# Patient Record
Sex: Male | Born: 1990 | Race: White | Hispanic: No | Marital: Single | Smoking: Never smoker
Health system: Southern US, Community
[De-identification: ages and names within clinical notes are randomized; demographics above are authoritative.]

## PROBLEM LIST (undated history)

## (undated) HISTORY — PX: NO PAST SURGERIES: SHX2092

---

## 2019-01-27 ENCOUNTER — Other Ambulatory Visit: Payer: Self-pay | Admitting: *Deleted

## 2019-01-27 DIAGNOSIS — Z20822 Contact with and (suspected) exposure to covid-19: Secondary | ICD-10-CM

## 2019-01-28 ENCOUNTER — Telehealth: Payer: Self-pay

## 2019-01-28 LAB — NOVEL CORONAVIRUS, NAA: SARS-CoV-2, NAA: NOT DETECTED

## 2019-01-28 NOTE — Telephone Encounter (Signed)
Patient called in requesting [facility] lab results and MyChart setup assistance - DOB/Address verified - Negative results given. Assisted with MyChart setup, no further questions.

## 2019-02-24 ENCOUNTER — Other Ambulatory Visit: Payer: Self-pay

## 2019-02-24 DIAGNOSIS — Z20822 Contact with and (suspected) exposure to covid-19: Secondary | ICD-10-CM

## 2019-02-27 LAB — NOVEL CORONAVIRUS, NAA: SARS-CoV-2, NAA: NOT DETECTED

## 2019-09-21 ENCOUNTER — Ambulatory Visit (INDEPENDENT_AMBULATORY_CARE_PROVIDER_SITE_OTHER): Payer: Managed Care, Other (non HMO) | Admitting: Adult Health

## 2019-09-21 ENCOUNTER — Encounter: Payer: Self-pay | Admitting: Adult Health

## 2019-09-21 ENCOUNTER — Ambulatory Visit
Admission: RE | Admit: 2019-09-21 | Discharge: 2019-09-21 | Disposition: A | Discharge status: Home / Self Care | Payer: Managed Care, Other (non HMO) | Source: Ambulatory Visit | Attending: Adult Health | Admitting: Adult Health

## 2019-09-21 ENCOUNTER — Ambulatory Visit
Admission: RE | Admit: 2019-09-21 | Discharge: 2019-09-21 | Disposition: A | Discharge status: Home / Self Care | Payer: Managed Care, Other (non HMO) | Attending: Adult Health | Admitting: Adult Health

## 2019-09-21 ENCOUNTER — Other Ambulatory Visit: Payer: Self-pay

## 2019-09-21 VITALS — BP 130/80 | HR 73 | Temp 96.8°F | Resp 15 | Ht 71.5 in | Wt 191.2 lb

## 2019-09-21 DIAGNOSIS — R0683 Snoring: Secondary | ICD-10-CM | POA: Diagnosis not present

## 2019-09-21 DIAGNOSIS — Z1389 Encounter for screening for other disorder: Secondary | ICD-10-CM

## 2019-09-21 DIAGNOSIS — Z0000 Encounter for general adult medical examination without abnormal findings: Secondary | ICD-10-CM | POA: Diagnosis not present

## 2019-09-21 DIAGNOSIS — R062 Wheezing: Secondary | ICD-10-CM

## 2019-09-21 DIAGNOSIS — G4730 Sleep apnea, unspecified: Secondary | ICD-10-CM | POA: Insufficient documentation

## 2019-09-21 DIAGNOSIS — G4700 Insomnia, unspecified: Secondary | ICD-10-CM

## 2019-09-21 DIAGNOSIS — Q825 Congenital non-neoplastic nevus: Secondary | ICD-10-CM

## 2019-09-21 DIAGNOSIS — Z8709 Personal history of other diseases of the respiratory system: Secondary | ICD-10-CM

## 2019-09-21 DIAGNOSIS — Z13220 Encounter for screening for lipoid disorders: Secondary | ICD-10-CM

## 2019-09-21 DIAGNOSIS — Z6826 Body mass index (BMI) 26.0-26.9, adult: Secondary | ICD-10-CM | POA: Insufficient documentation

## 2019-09-21 DIAGNOSIS — R5383 Other fatigue: Secondary | ICD-10-CM | POA: Insufficient documentation

## 2019-09-21 LAB — POCT URINALYSIS DIPSTICK
Blood, UA: NEGATIVE
Glucose, UA: NEGATIVE
Ketones, UA: NEGATIVE
Leukocytes, UA: NEGATIVE
Nitrite, UA: NEGATIVE
Protein, UA: NEGATIVE
Spec Grav, UA: 1.02 (ref 1.010–1.025)
Urobilinogen, UA: 0.2 E.U./dL
pH, UA: 6.5 (ref 5.0–8.0)

## 2019-09-21 MED ORDER — ALBUTEROL SULFATE HFA 108 (90 BASE) MCG/ACT IN AERS: 1.0000 | INHALATION_SPRAY | Freq: Four times a day (QID) | RESPIRATORY_TRACT | 0 refills | Status: AC | PRN

## 2019-09-21 NOTE — Progress Notes (Signed)
Chest x ray within normal limits. Keep plan as discussed in office. Follow up if any symptoms persist/ change or worsen at anytime.

## 2019-09-21 NOTE — Progress Notes (Signed)
New patient visit   Patient: Gary Patton   DOB: May 11, 1990   28 y.o. Male  MRN: [id number] Visit Date: 09/21/2019  Today's healthcare provider: Jairo Ben, FNP   Chief Complaint  Patient presents with   New Patient (Initial Visit)   Subjective    Gary Patton is a 29 y.o. male who presents today as a new patient to establish care.  HPI  Patient reports that he feels fairly well today but would like to address concerns of sleep apnea. Patient reports that he lives with his girlfriend and she has noticed for the past year and half that patient stops breathing at night when asleep.   Patient denies difficulty falling asleep or snoring, he states that he does wake up occasionally but averages 6 hrs a night. Patient reports that he does not follow a well balanced diet but is working on improving, he is not actively exercising at this time.    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  Patient does report that he does well going up stairs, denies any shortness of breath or distress.  Denies any asthma allergy symptoms.  Patient did have a respiratory infection with a cough 2 weeks ago, feeling well, but still fatigued.  Patient  denies any fever, body aches,chills, rash, chest pain,nausea, vomiting, or diarrhea.  Denies dizziness, lightheadedness, pre syncopal or syncopal episodes.   History reviewed. No pertinent past medical history. Past Surgical History:  Procedure Laterality Date   NO PAST SURGERIES     Family Status  Relation Name Status   Father  (Not Specified)   MGF  (Not Specified)   Family History  Problem Relation Age of Onset   Hypertension Father    Alzheimer's disease Maternal Grandfather    Dementia Maternal Grandfather    Social History   Socioeconomic History   Marital status: Single    Spouse name: Not on file   Number of children: Not on file   Years of education: Not on file   Highest  education level: Not on file  Occupational History   Not on file  Tobacco Use   Smoking status: Never Smoker   Smokeless tobacco: Never Used  Substance and Sexual Activity   Alcohol use: Yes    Alcohol/week: 4.0 standard drinks    Types: 3 Cans of beer, 1 Shots of liquor per week   Drug use: Never   Sexual activity: Not on file  Other Topics Concern   Not on file  Social History Narrative   Not on file   Social Determinants of Health   Financial Resource Strain:    Difficulty of Paying Living Expenses:   Food Insecurity:    Worried About Programme researcher, broadcasting/film/video in the Last Year:    Barista in the Last Year:   Transportation Needs:    Freight forwarder (Medical):    Lack of Transportation (Non-Medical):   Physical Activity:    Days of Exercise per Week:    Minutes of Exercise per Session:   Stress:    Feeling of Stress :   Social Connections:    Frequency of Communication with Friends and Family:    Frequency of Social Gatherings with Friends and Family:    Attends Religious Services:    Active Member of Clubs or Organizations:    Attends Banker Meetings:    Marital Status:    No  outpatient medications prior to visit.   No facility-administered medications prior to visit.   No Known Allergies   There is no immunization history on file for this patient.  Health Maintenance  Topic Date Due   Hepatitis C Screening  Never done   COVID-19 Vaccine (1) Never done   HIV Screening  Never done   TETANUS/TDAP  Never done   INFLUENZA VACCINE  11/07/2019    Patient Care Team: Berniece Pap, FNP as PCP - General (Family Medicine)  Review of Systems  Constitutional: Positive for fatigue. Negative for activity change, appetite change, chills, diaphoresis, fever and unexpected weight change.  HENT:       Ear fullness and trouble hearing at times.  Respiratory: Positive for apnea (Noticed a girlfriend at night when  sleeping.) and shortness of breath (Only at night upon wakening.). Negative for cough, choking, chest tightness, wheezing and stridor.        Respiratory infection over 2 weeks ago coughing was not seen in any clinic.  Cardiovascular: Negative for chest pain, palpitations and leg swelling.  Gastrointestinal: Negative.   Genitourinary: Negative.   Musculoskeletal: Negative.   Skin: Positive for color change (Port wine stain left dorsal hand.  Has not since birth.).  Neurological: Negative.   Hematological: Negative.   Psychiatric/Behavioral: Positive for decreased concentration and sleep disturbance. Negative for agitation, behavioral problems, confusion, dysphoric mood, hallucinations, self-injury and suicidal ideas. The patient is not nervous/anxious and is not hyperactive.   All other systems reviewed and are negative.     Objective    BP 130/80    Pulse 73    Temp (!) 96.8 F (36 C) (Oral)    Resp 15    Ht 5' 11.5" (1.816 m)    Wt 191 lb 3.2 oz (86.7 kg)    SpO2 99%    BMI 26.30 kg/m  Physical Exam Vitals reviewed.  Constitutional:      General: He is not in acute distress.    Appearance: Normal appearance. He is not ill-appearing, toxic-appearing or diaphoretic.     Comments: Patient is alert and oriented and responsive to questions Engages in eye contact with provider. Speaks in full sentences without any pauses without any shortness of breath or distress.  Skin is pale.  HENT:     Head: Normocephalic and atraumatic.     Right Ear: Tympanic membrane, ear canal and external ear normal. There is impacted cerumen.     Left Ear: Tympanic membrane, ear canal and external ear normal. There is impacted cerumen (partial).     Ears:     Comments: Right ear hard cerumen     Nose: Nose normal. No congestion or rhinorrhea.     Mouth/Throat:     Mouth: Mucous membranes are moist.     Pharynx: Oropharynx is clear. No oropharyngeal exudate or posterior oropharyngeal erythema.  Eyes:      General: No scleral icterus.    Extraocular Movements: Extraocular movements intact.     Conjunctiva/sclera: Conjunctivae normal.     Pupils: Pupils are equal, round, and reactive to light.  Neck:     Vascular: No carotid bruit.  Cardiovascular:     Rate and Rhythm: Normal rate and regular rhythm.     Pulses: Normal pulses.     Heart sounds: Normal heart sounds. No murmur heard.  No friction rub. No gallop.   Pulmonary:     Effort: Pulmonary effort is normal. No respiratory distress.     Breath  sounds: No stridor. Examination of the right-lower field reveals wheezing. Examination of the left-lower field reveals wheezing. Wheezing (lower lobes ( bilateral) mild expiratory) present. No rhonchi or rales.  Chest:     Chest wall: No tenderness.  Abdominal:     General: Bowel sounds are normal. There is no distension.     Palpations: Abdomen is soft. There is no mass.     Tenderness: There is no abdominal tenderness. There is no right CVA tenderness, left CVA tenderness, guarding or rebound.     Hernia: No hernia is present.  Genitourinary:    Epididymis:     Right: No tenderness.     Left: No mass or tenderness.     Tanner stage (genital): 5.     Comments: Declined does self testicular exams at home. Musculoskeletal:        General: No tenderness or deformity. Normal range of motion.     Cervical back: Normal range of motion and neck supple. No rigidity or tenderness.  Lymphadenopathy:     Cervical: No cervical adenopathy.  Skin:    General: Skin is warm and dry.     Capillary Refill: Capillary refill takes less than 2 seconds.     Comments: Port wine stain left hand  Neurological:     General: No focal deficit present.     Mental Status: He is alert and oriented to person, place, and time. Mental status is at baseline.     Cranial Nerves: No cranial nerve deficit.     Sensory: No sensory deficit.     Motor: No weakness.     Coordination: Coordination normal.     Gait: Gait  normal.     Deep Tendon Reflexes: Reflexes normal.  Psychiatric:        Mood and Affect: Mood normal.        Behavior: Behavior normal.        Thought Content: Thought content normal.        Judgment: Judgment normal.      Depression Screen PHQ 2/9 Scores 09/21/2019  PHQ - 2 Score 2  PHQ- 9 Score 5   No results found for any visits on 09/21/19.  Assessment & Plan       1. Routine adult health maintenance The patient is advised to begin progressive daily aerobic exercise program, follow a low fat, low cholesterol diet, attempt to lose weight, reduce exposure to stress, improve dietary compliance, continue current healthy lifestyle patterns and return for routine annual checkups. Dental biannually and vision exams recommend yearly.   2. Screening for blood or protein in urine  - POCT urinalysis dipstick  3. Loud snoring Sp[lit night sleep study  - Ambulatory referral to Sleep Studies  4. Wheezing  - DG Chest 2 View; Future - albuterol (VENTOLIN HFA) 108 (90 Base) MCG/ACT inhaler; Inhale 1-2 puffs into the lungs every 6 (six) hours as needed for wheezing or shortness of breath.  Dispense: 18 g; Refill: 0  5. Fatigue, unspecified type  - CBC with Differential/Platelet - Comprehensive metabolic panel - TSH - VITAMIN D 25 Hydroxy (Vit-D Deficiency, Fractures) - Ambulatory referral to Sleep Studies  6. Screening for lipid disorders - Lipid panel  7. Insomnia, unspecified type - Ambulatory referral to Sleep Studies  8. History of URI (upper respiratory infection)- 2 weeks ago  CXR to rule out any post pneumonia. CXR today.   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    Return in  about 1 month (around 10/21/2019).      Advised patient call the office or your primary care doctor for an appointment if no improvement within 72 hours or if any symptoms change or worsen at any time  Advised ER or urgent Care if after hours or on weekend. Call 911 for  emergency symptoms at any time.Patinet verbalized understanding of all instructions given/reviewed and treatment plan and has no further questions or concerns at this time.      IBeverely Pace Quinten [place], FNP, have reviewed all documentation for this visit. The documentation on 09/21/19 for the exam, diagnosis, procedures, and orders are all accurate and complete.    Jairo Ben, FNP  [facility] [phone] (phone) [phone] (fax)  [facility]

## 2019-09-21 NOTE — Patient Instructions (Signed)
Health Maintenance, Male Adopting a healthy lifestyle and getting preventive care are important in promoting health and wellness. Ask your health care provider about:  The right schedule for you to have regular tests and exams.  Things you can do on your own to prevent diseases and keep yourself healthy. What should I know about diet, weight, and exercise? Eat a healthy diet   Eat a diet that includes plenty of vegetables, fruits, low-fat dairy products, and lean protein.  Do not eat a lot of foods that are high in solid fats, added sugars, or sodium. Maintain a healthy weight Body mass index (BMI) is a measurement that can be used to identify possible weight problems. It estimates body fat based on height and weight. Your health care provider can help determine your BMI and help you achieve or maintain a healthy weight. Get regular exercise Get regular exercise. This is one of the most important things you can do for your health. Most adults should:  Exercise for at least 150 minutes each week. The exercise should increase your heart rate and make you sweat (moderate-intensity exercise).  Do strengthening exercises at least twice a week. This is in addition to the moderate-intensity exercise.  Spend less time sitting. Even light physical activity can be beneficial. Watch cholesterol and blood lipids Have your blood tested for lipids and cholesterol at 27 years of age, then have this test every 5 years. You may need to have your cholesterol levels checked more often if:  Your lipid or cholesterol levels are high.  You are older than 27 years of age.  You are at high risk for heart disease. What should I know about cancer screening? Many types of cancers can be detected early and may often be prevented. Depending on your health history and family history, you may need to have cancer screening at various ages. This may include screening for:  Colorectal cancer.  Prostate  cancer.  Skin cancer.  Lung cancer. What should I know about heart disease, diabetes, and high blood pressure? Blood pressure and heart disease  High blood pressure causes heart disease and increases the risk of stroke. This is more likely to develop in people who have high blood pressure readings, are of African descent, or are overweight.  Talk with your health care provider about your target blood pressure readings.  Have your blood pressure checked: ? Every 3-5 years if you are 18-39 years of age. ? Every year if you are 40 years old or older.  If you are between the ages of 65 and 75 and are a current or former smoker, ask your health care provider if you should have a one-time screening for abdominal aortic aneurysm (AAA). Diabetes Have regular diabetes screenings. This checks your fasting blood sugar level. Have the screening done:  Once every three years after age 45 if you are at a normal weight and have a low risk for diabetes.  More often and at a younger age if you are overweight or have a high risk for diabetes. What should I know about preventing infection? Hepatitis B If you have a higher risk for hepatitis B, you should be screened for this virus. Talk with your health care provider to find out if you are at risk for hepatitis B infection. Hepatitis C Blood testing is recommended for:  Everyone born from 1945 through 1965.  Anyone with known risk factors for hepatitis C. Sexually transmitted infections (STIs)  You should be screened each year   for STIs, including gonorrhea and chlamydia, if: ? You are sexually active and are younger than 27 years of age. ? You are older than 27 years of age and your health care provider tells you that you are at risk for this type of infection. ? Your sexual activity has changed since you were last screened, and you are at increased risk for chlamydia or gonorrhea. Ask your health care provider if you are at risk.  Ask your  health care provider about whether you are at high risk for HIV. Your health care provider may recommend a prescription medicine to help prevent HIV infection. If you choose to take medicine to prevent HIV, you should first get tested for HIV. You should then be tested every 3 months for as long as you are taking the medicine. Follow these instructions at home: Lifestyle  Do not use any products that contain nicotine or tobacco, such as cigarettes, e-cigarettes, and chewing tobacco. If you need help quitting, ask your health care provider.  Do not use street drugs.  Do not share needles.  Ask your health care provider for help if you need support or information about quitting drugs. Alcohol use  Do not drink alcohol if your health care provider tells you not to drink.  If you drink alcohol: ? Limit how much you have to 0-2 drinks a day. ? Be aware of how much alcohol is in your drink. In the [place] one drink equals one 12 oz bottle of beer (355 mL), one 5 oz glass of wine (148 mL), or one 1 oz glass of hard liquor (44 mL). General instructions  Schedule regular health, dental, and eye exams.  Stay current with your vaccines.  Tell your health care provider if: ? You often feel depressed. ? You have ever been abused or do not feel safe at home. Summary  Adopting a healthy lifestyle and getting preventive care are important in promoting health and wellness.  Follow your health care provider's instructions about healthy diet, exercising, and getting tested or screened for diseases.  Follow your health care provider's instructions on monitoring your cholesterol and blood pressure. This information is not intended to replace advice given to you by your health care provider. Make sure you discuss any questions you have with your health care provider. Document Revised: 03/18/2018 Document Reviewed: 03/18/2018 Elsevier Patient Education  [place].   Albuterol inhalation  aerosol What is this medicine? ALBUTEROL (al [place]) is a bronchodilator. It helps open up the airways in your lungs to make it easier to breathe. This medicine is used to treat and to prevent bronchospasm. This medicine may be used for other purposes; ask your health care provider or pharmacist if you have questions. COMMON BRAND NAME(S): Proair HFA, Proventil, Proventil HFA, Respirol, Ventolin, Ventolin HFA What should I tell my health care provider before I take this medicine? They need to know if you have any of the following conditions:  diabetes  heart disease or irregular heartbeat  high blood pressure  pheochromocytoma  seizures  thyroid disease  an unusual or allergic reaction to albuterol, levalbuterol, other medicines, foods, dyes, or preservatives  pregnant or trying to get pregnant  breast-feeding How should I use this medicine? This medicine is for inhalation through the mouth. Follow the directions on your prescription label. Take your medicine at regular intervals. Do not use more often than directed. Make sure that you are using your inhaler correctly. Ask your doctor or health  care provider if you have any questions. Talk to your pediatrician regarding the use of this medicine in children. While this drug may be prescribed for children as young as 4 years for selected conditions, precautions do apply. Overdosage: If you think you have taken too much of this medicine contact a poison control center or emergency room at once. NOTE: This medicine is only for you. Do not share this medicine with others. What if I miss a dose? If you miss a dose, use it as soon as you can. If it is almost time for your next dose, use only that dose. Do not use double or extra doses. What may interact with this medicine?  anti-infectives like chloroquine and pentamidine  caffeine  cisapride  diuretics  medicines for colds  medicines for depression or for emotional or  psychotic conditions  medicines for weight loss including some herbal products  methadone  some antibiotics like clarithromycin, erythromycin, levofloxacin, and linezolid  some heart medicines  steroid hormones like dexamethasone, cortisone, hydrocortisone  theophylline  thyroid hormones This list may not describe all possible interactions. Give your health care provider a list of all the medicines, herbs, non-prescription drugs, or dietary supplements you use. Also tell them if you smoke, drink alcohol, or use illegal drugs. Some items may interact with your medicine. What should I watch for while using this medicine? Tell your doctor or health care professional if your symptoms do not improve. Do not use extra albuterol. If your asthma or bronchitis gets worse while you are using this medicine, call your doctor right away. If your mouth gets dry try chewing sugarless gum or sucking hard candy. Drink water as directed. What side effects may I notice from receiving this medicine? Side effects that you should report to your doctor or health care professional as soon as possible:  allergic reactions like skin rash, itching or hives, swelling of the face, lips, or tongue  breathing problems  chest pain  feeling faint or lightheaded, falls  high blood pressure  irregular heartbeat  fever  muscle cramps or weakness  pain, tingling, numbness in the hands or feet  vomiting Side effects that usually do not require medical attention (report to your doctor or health care professional if they continue or are bothersome):  changes in taste  cough  dry mouth  headache  nervousness or trembling  stomach upset  stuffy or runny nose  throat irritation  trouble sleeping This list may not describe all possible side effects. Call your doctor for medical advice about side effects. You may report side effects to FDA at 1-800-FDA-1088. Where should I keep my medicine? Keep out  of the reach of children. Store Proventil HFA and ProAir HFA at room temperature between 15 and 25 degrees C (59 and 77 degrees F). Store Ventolin HFA at room temperature between 20 and 25 degrees C (68 and 77 degrees F); it may be stored between 15 and 30 degrees C (59 and 86 degrees F) on occasion. The contents are under pressure and may burst when exposed to heat or flame. Do not freeze. This medicine does not work as well if it is too cold. Throw away the inhaler when the dose counter displays "0" or after the expiration date on the package, whichever comes first. Ventolin HFA should be thrown away 12 months after removing it from the foil pouch. NOTE: This sheet is a summary. It may not cover all possible information. If you have questions about this  medicine, talk to your doctor, pharmacist, or health care provider.  2020 Elsevier/Gold Standard (2018-07-09 12:46:54)  Testicular Self-Exam A self-exam of your testicles (testicular self-exam) is looking at and feeling your testicles for unusual lumps or swelling. Swelling, lumps, or pain can be caused by:  Injuries.  Puffiness, redness, and soreness (inflammation).  Infection.  Extra fluids around your testicle (hydrocele).  Twisted testicles (testicular torsion).  Cancer of the testicle (testicular cancer). Why is it important to do a self-exam of testicles? You may need to do self-exams if you are at risk for cancer of the testicles. You may be at risk if you have:  A testicle that has not descended (cryptorchidism).  A history of cancer of the testicle.  A family history of cancer of the testicle. How to do a self-exam of testicles It is easiest to do a self-exam after a warm bath or shower. Testicles are harder to examine when you are cold. A normal testicle is egg-shaped and feels firm. It is smooth, and it is not tender. At the back of your testicles, there is a firm cord that feels like spaghetti (spermatic cord). Look and  feel for changes  Stand and hold your penis away from your body.  Look at each testicle to check for lumps or swelling.  Roll each testicle between your thumb and finger. Feel the whole testicle. Feel for: ? Lumps. ? Swelling. ? Discomfort.  Check for swelling or tender bumps in the groin area. Your groin is where your lower belly (abdomen) meets your upper thighs. Contact a health care provider if:  You find a bump or lump. This may be like a small, hard bump that is the size of a pea.  You find swelling.  You find pain.  You find soreness.  You see or feel any other changes. Summary  A self-exam of your testicles is looking at and feeling your testicles for lumps or swelling.  You may need to do self-exams if you are at risk for cancer of the testicle.  You should check each of your testicles for lumps, swelling, or discomfort.  You should check for swelling or tender bumps in the groin area. Your groin is where your lower belly (abdomen) meets your upper thighs. This information is not intended to replace advice given to you by your health care provider. Make sure you discuss any questions you have with your health care provider. Document Revised: 07/16/2018 Document Reviewed: 02/19/2016 Elsevier Patient Education  2020 [employer].

## 2019-09-22 ENCOUNTER — Other Ambulatory Visit: Payer: Self-pay | Admitting: Adult Health

## 2019-09-22 DIAGNOSIS — E559 Vitamin D deficiency, unspecified: Secondary | ICD-10-CM | POA: Insufficient documentation

## 2019-09-22 LAB — CBC WITH DIFFERENTIAL/PLATELET
Basophils Absolute: 0 10*3/uL (ref 0.0–0.2)
Basos: 1 %
EOS (ABSOLUTE): 0.2 10*3/uL (ref 0.0–0.4)
Eos: 3 %
Hematocrit: 40.4 % (ref 37.5–51.0)
Hemoglobin: 13.2 g/dL (ref 13.0–17.7)
Immature Grans (Abs): 0 10*3/uL (ref 0.0–0.1)
Immature Granulocytes: 1 %
Lymphocytes Absolute: 1.8 10*3/uL (ref 0.7–3.1)
Lymphs: 27 %
MCH: 28.9 pg (ref 26.6–33.0)
MCHC: 32.7 g/dL (ref 31.5–35.7)
MCV: 89 fL (ref 79–97)
Monocytes Absolute: 0.6 10*3/uL (ref 0.1–0.9)
Monocytes: 9 %
Neutrophils Absolute: 4 10*3/uL (ref 1.4–7.0)
Neutrophils: 59 %
Platelets: 300 10*3/uL (ref 150–450)
RBC: 4.56 x10E6/uL (ref 4.14–5.80)
RDW: 12.9 % (ref 11.6–15.4)
WBC: 6.7 10*3/uL (ref 3.4–10.8)

## 2019-09-22 LAB — VITAMIN D 25 HYDROXY (VIT D DEFICIENCY, FRACTURES): Vit D, 25-Hydroxy: 16.7 ng/mL — ABNORMAL LOW (ref 30.0–100.0)

## 2019-09-22 LAB — COMPREHENSIVE METABOLIC PANEL
ALT: 31 IU/L (ref 0–44)
AST: 24 IU/L (ref 0–40)
Albumin/Globulin Ratio: 2 (ref 1.2–2.2)
Albumin: 4.7 g/dL (ref 4.1–5.2)
Alkaline Phosphatase: 65 IU/L (ref 48–121)
BUN/Creatinine Ratio: 12 (ref 9–20)
BUN: 10 mg/dL (ref 6–20)
Bilirubin Total: 0.3 mg/dL (ref 0.0–1.2)
CO2: 22 mmol/L (ref 20–29)
Calcium: 9.4 mg/dL (ref 8.7–10.2)
Chloride: 104 mmol/L (ref 96–106)
Creatinine, Ser: 0.83 mg/dL (ref 0.76–1.27)
GFR calc Af Amer: 140 mL/min/{1.73_m2} (ref >59)
GFR calc non Af Amer: 121 mL/min/{1.73_m2} (ref >59)
Globulin, Total: 2.3 g/dL (ref 1.5–4.5)
Glucose: 90 mg/dL (ref 65–99)
Potassium: 4 mmol/L (ref 3.5–5.2)
Sodium: 140 mmol/L (ref 134–144)
Total Protein: 7 g/dL (ref 6.0–8.5)

## 2019-09-22 LAB — LIPID PANEL
Chol/HDL Ratio: 3.4 ratio (ref 0.0–5.0)
Cholesterol, Total: 185 mg/dL (ref 100–199)
HDL: 55 mg/dL (ref >39)
LDL Chol Calc (NIH): 117 mg/dL — ABNORMAL HIGH (ref 0–99)
Triglycerides: 70 mg/dL (ref 0–149)
VLDL Cholesterol Cal: 13 mg/dL (ref 5–40)

## 2019-09-22 LAB — TSH: TSH: 1.86 u[IU]/mL (ref 0.450–4.500)

## 2019-09-22 MED ORDER — VITAMIN D (ERGOCALCIFEROL) 1.25 MG (50000 UNIT) PO CAPS: 50000.0000 [IU] | ORAL_CAPSULE | ORAL | 0 refills | Status: DC

## 2019-09-22 NOTE — Progress Notes (Signed)
CBC within normal limits no anemia or signs of infection. CMP within normal limits glucose, kidney and liver function within normal limits.  LDL elevated.  Discuss lifestyle modification with patient e.g. increase exercise, fiber, fruits, vegetables, lean meat, and omega 3/fish intake and decrease saturated fat.   TSH for thyroid within normal limits.  Vitamin D is low, can be contributing to his fatigue, will send in Vitamin D at 50,000 units by mouth once every seven days ( taking once weekly) for 12 weeks. Then will need a recheck Vitamin D level in 3 months at lab. Please add as future order.

## 2019-09-22 NOTE — Progress Notes (Signed)
Meds ordered this encounter  Medications  . Vitamin D, Ergocalciferol, (DRISDOL) 1.25 MG (50000 UNIT) CAPS capsule    Sig: Take 1 capsule (50,000 Units total) by mouth every 7 (seven) days. (once weekly)    Dispense:  12 capsule    Refill:  0   Orders Placed This Encounter  Procedures  . VITAMIN D 25 Hydroxy (Vit-D Deficiency, Fractures)    Standing Status:   Future    Standing Expiration Date:   01/22/2020

## 2019-10-22 ENCOUNTER — Telehealth: Payer: Self-pay

## 2019-10-22 NOTE — Telephone Encounter (Signed)
Left message for patient to call back okay for [facility] triage to advise patient of message below. KW

## 2019-10-22 NOTE — Telephone Encounter (Signed)
Pt. returned call.  Advised of result note per Marvell Fuller re: negative sleep study, and to schedule office visit for any persistent concerns or symptoms.  Pt. Verb. Understanding.

## 2019-10-22 NOTE — Telephone Encounter (Signed)
-----   Message from Berniece Pap, FNP sent at 10/21/2019 10:09 AM EDT ----- Nicholos Johns,  Please let patient know that his sleep study shows he does not have sleep apnea at this time. Advise to follow up if any persistent concerns or symptoms with office visit if needed.   Thank you,  Marvell Fuller MSN, AGNP-C, FNP-C  Family Nurse Practitioner  Adult Geriatric Nurse Practitioner

## 2019-11-05 ENCOUNTER — Ambulatory Visit: Payer: Self-pay | Admitting: Adult Health

## 2019-11-10 ENCOUNTER — Ambulatory Visit (INDEPENDENT_AMBULATORY_CARE_PROVIDER_SITE_OTHER): Payer: Managed Care, Other (non HMO) | Admitting: Adult Health

## 2019-11-10 ENCOUNTER — Other Ambulatory Visit: Payer: Self-pay

## 2019-11-10 ENCOUNTER — Encounter: Payer: Self-pay | Admitting: Adult Health

## 2019-11-10 VITALS — BP 122/84 | HR 85 | Temp 98.4°F | Wt 192.0 lb

## 2019-11-10 DIAGNOSIS — Z8709 Personal history of other diseases of the respiratory system: Secondary | ICD-10-CM

## 2019-11-10 DIAGNOSIS — R062 Wheezing: Secondary | ICD-10-CM | POA: Diagnosis not present

## 2019-11-10 DIAGNOSIS — E559 Vitamin D deficiency, unspecified: Secondary | ICD-10-CM | POA: Diagnosis not present

## 2019-11-10 NOTE — Progress Notes (Signed)
Established patient visit   Patient: Gary Patton   DOB: [birth year]   29 y.o. Male  MRN: [id number] Visit Date: 11/10/2019  Today's healthcare provider: Jairo Ben, FNP   Chief Complaint  Patient presents with  . Wheezing  . Follow-up   Subjective    HPI    Follow up for Wheezing  The patient was last seen for this 1 months ago. Changes made at last visit include adding albuterol inhaler as needed.  He was recovering from a mild upper respiratory infection from 2 weeks prior to our last new patient appointment and incidently wheezing was heard on exam. He is here today for follow up. He was given albuterol and has only had to use twice and his symptoms all resolved he reports.  He did not feel bad at time of exam.   He reports excellent compliance with treatment. He feels that condition is resolved . He is not having side effects.   Continuing to take Vitamin D supplement will return for lab recheck on week 13 when treatment is complete.  No history of asthma.   Patient  denies any fever, body aches,chills, rash, chest pain, shortness of breath, nausea, vomiting, or diarrhea.  Denies dizziness, lightheadedness, pre syncopal or syncopal episodes.  Denies any other concerns.    -----------------------------------------------------------------------------------------    Patient Active Problem List   Diagnosis Date Noted  . Vitamin D insufficiency 09/22/2019  . Insomnia 09/21/2019  . Routine adult health maintenance 09/21/2019  . Fatigue 09/21/2019  . Wheezing 09/21/2019  . Sleep apnea 09/21/2019  . History of URI (upper respiratory infection)- 2 weeks ago  09/21/2019  . Port-wine stain of skin- left hand  09/21/2019  . Body mass index 26.0-26.9, adult 09/21/2019   Social History   Tobacco Use  . Smoking status: Never Smoker  . Smokeless tobacco: Never Used  Substance Use Topics  . Alcohol use: Yes    Alcohol/week: 4.0 standard drinks     Types: 3 Cans of beer, 1 Shots of liquor per week  . Drug use: Never   No Known Allergies   Medications: Outpatient Medications Prior to Visit  Medication Sig  . albuterol (VENTOLIN HFA) 108 (90 Base) MCG/ACT inhaler Inhale 1-2 puffs into the lungs every 6 (six) hours as needed for wheezing or shortness of breath.  . Vitamin D, Ergocalciferol, (DRISDOL) 1.25 MG (50000 UNIT) CAPS capsule Take 1 capsule (50,000 Units total) by mouth every 7 (seven) days. (once weekly)   No facility-administered medications prior to visit.    Review of Systems  Constitutional: Negative.   HENT: Negative.   Respiratory: Negative for apnea, cough, choking, chest tightness, shortness of breath, wheezing and stridor.   Cardiovascular: Negative.   Gastrointestinal: Negative.   Genitourinary: Negative.   Musculoskeletal: Negative.   Skin: Negative.   Neurological: Negative.   Hematological: Negative.   Psychiatric/Behavioral: Negative.       Objective    BP 122/84 (BP Location: Left Arm, Patient Position: Sitting, Cuff Size: Large)   Pulse 85   Temp 98.4 F (36.9 C) (Oral)   Wt 192 lb (87.1 kg)   SpO2 98%   BMI 26.41 kg/m    Physical Exam Constitutional:      General: He is not in acute distress.    Appearance: Normal appearance. He is well-developed. He is not ill-appearing, toxic-appearing or diaphoretic.  HENT:     Head: Normocephalic and atraumatic.     Right Ear: Hearing, tympanic membrane,  ear canal and external ear normal.     Left Ear: Hearing, tympanic membrane, ear canal and external ear normal.     Nose: Nose normal.     Mouth/Throat:     Mouth: Mucous membranes are moist.     Pharynx: Uvula midline. No oropharyngeal exudate.  Eyes:     General: Lids are normal. No scleral icterus.       Right eye: No discharge.        Left eye: No discharge.     Conjunctiva/sclera: Conjunctivae normal.     Pupils: Pupils are equal, round, and reactive to light.  Neck:     Thyroid: No  thyromegaly.     Vascular: Normal carotid pulses. No hepatojugular reflux or JVD.     Trachea: Trachea and phonation normal. No tracheal tenderness or tracheal deviation.     Meningeal: Brudzinski's sign absent.  Cardiovascular:     Rate and Rhythm: Normal rate and regular rhythm.     Pulses: Normal pulses.     Heart sounds: Normal heart sounds, S1 normal and S2 normal. Heart sounds not distant. No murmur heard.  No friction rub. No gallop.   Pulmonary:     Effort: Pulmonary effort is normal. No accessory muscle usage or respiratory distress.     Breath sounds: Normal breath sounds. No stridor. No wheezing, rhonchi or rales.  Chest:     Chest wall: No tenderness.  Abdominal:     Palpations: Abdomen is soft.  Musculoskeletal:        General: No tenderness or deformity. Normal range of motion.     Cervical back: Full passive range of motion without pain, normal range of motion and neck supple. No rigidity or tenderness.     Right lower leg: No edema.     Left lower leg: No edema.  Lymphadenopathy:     Head:     Right side of head: No submental, submandibular, tonsillar, preauricular, posterior auricular or occipital adenopathy.     Left side of head: No submental, submandibular, tonsillar, preauricular, posterior auricular or occipital adenopathy.     Cervical: No cervical adenopathy.  Skin:    General: Skin is warm and dry.     Capillary Refill: Capillary refill takes less than 2 seconds.     Coloration: Skin is not pale.     Findings: No erythema or rash.     Nails: There is no clubbing.  Neurological:     Mental Status: He is alert and oriented to person, place, and time.     GCS: GCS eye subscore is 4. GCS verbal subscore is 5. GCS motor subscore is 6.     Cranial Nerves: No cranial nerve deficit.     Sensory: No sensory deficit.     Motor: No weakness or abnormal muscle tone.     Coordination: Coordination normal.     Gait: Gait normal.     Deep Tendon Reflexes: Reflexes  are normal and symmetric. Reflexes normal.  Psychiatric:        Mood and Affect: Mood normal.        Speech: Speech normal.        Behavior: Behavior normal.        Thought Content: Thought content normal.        Judgment: Judgment normal.       No results found for any visits on 11/10/19.  Assessment & Plan     Vitamin D insufficiency - Plan: VITAMIN D 25 Hydroxy (  Vit-D Deficiency, Fractures)  Wheezing  History of URI (upper respiratory infection)- 2 weeks ago   Wheezing has resolved.declined need for refill of Albuterol.   Recheck vitamin D lab as advised 3 months from last.   Orders Placed This Encounter  Procedures  . VITAMIN D 25 Hydroxy (Vit-D Deficiency, Fractures)    Standing Status:   Future    Standing Expiration Date:   05/13/2020    Red Flags discussed. The patient was given clear instructions to go to ER or return to medical center if any red flags develop, symptoms do not improve, worsen or new problems develop. They verbalized understanding.   Return in about 1 week (around 11/17/2019), or if symptoms worsen or fail to improve, for at any time for any worsening symptoms, Go to Emergency room/ urgent care if worse.         Jairo Ben, FNP  [facility] [phone] (phone) [phone] (fax)  [facility]

## 2019-11-10 NOTE — Patient Instructions (Signed)

## 2019-11-11 ENCOUNTER — Encounter: Payer: Self-pay | Admitting: Adult Health

## 2019-12-23 ENCOUNTER — Other Ambulatory Visit: Payer: Self-pay

## 2019-12-23 DIAGNOSIS — E559 Vitamin D deficiency, unspecified: Secondary | ICD-10-CM

## 2019-12-24 LAB — VITAMIN D 25 HYDROXY (VIT D DEFICIENCY, FRACTURES): Vit D, 25-Hydroxy: 33.3 ng/mL (ref 30.0–100.0)

## 2019-12-27 ENCOUNTER — Other Ambulatory Visit: Payer: Self-pay | Admitting: Adult Health

## 2019-12-27 NOTE — Progress Notes (Signed)
Medications Discontinued During This Encounter  Medication Reason  . Vitamin D, Ergocalciferol, (DRISDOL) 1.25 MG (50000 UNIT) CAPS capsule Completed Course

## 2019-12-27 NOTE — Progress Notes (Signed)
Discontinue prescription vitamin D  now that levels are within range and advise over the counter vitamin D3 at 4,000 international units once daily by mouth. Please add recheck lab in 6 months vitamin D.

## 2022-03-05 IMAGING — CR DG CHEST 2V
1 series · 2 of 2 positions shown · non-contrast
Comparison: None.

CLINICAL DATA: Cough and congestion for 2 weeks

EXAM:
CHEST - 2 VIEW

[Series 1: dg chest 2 view · 0.14mm/px · 2 of 2 slices shown]
[im 1/2]
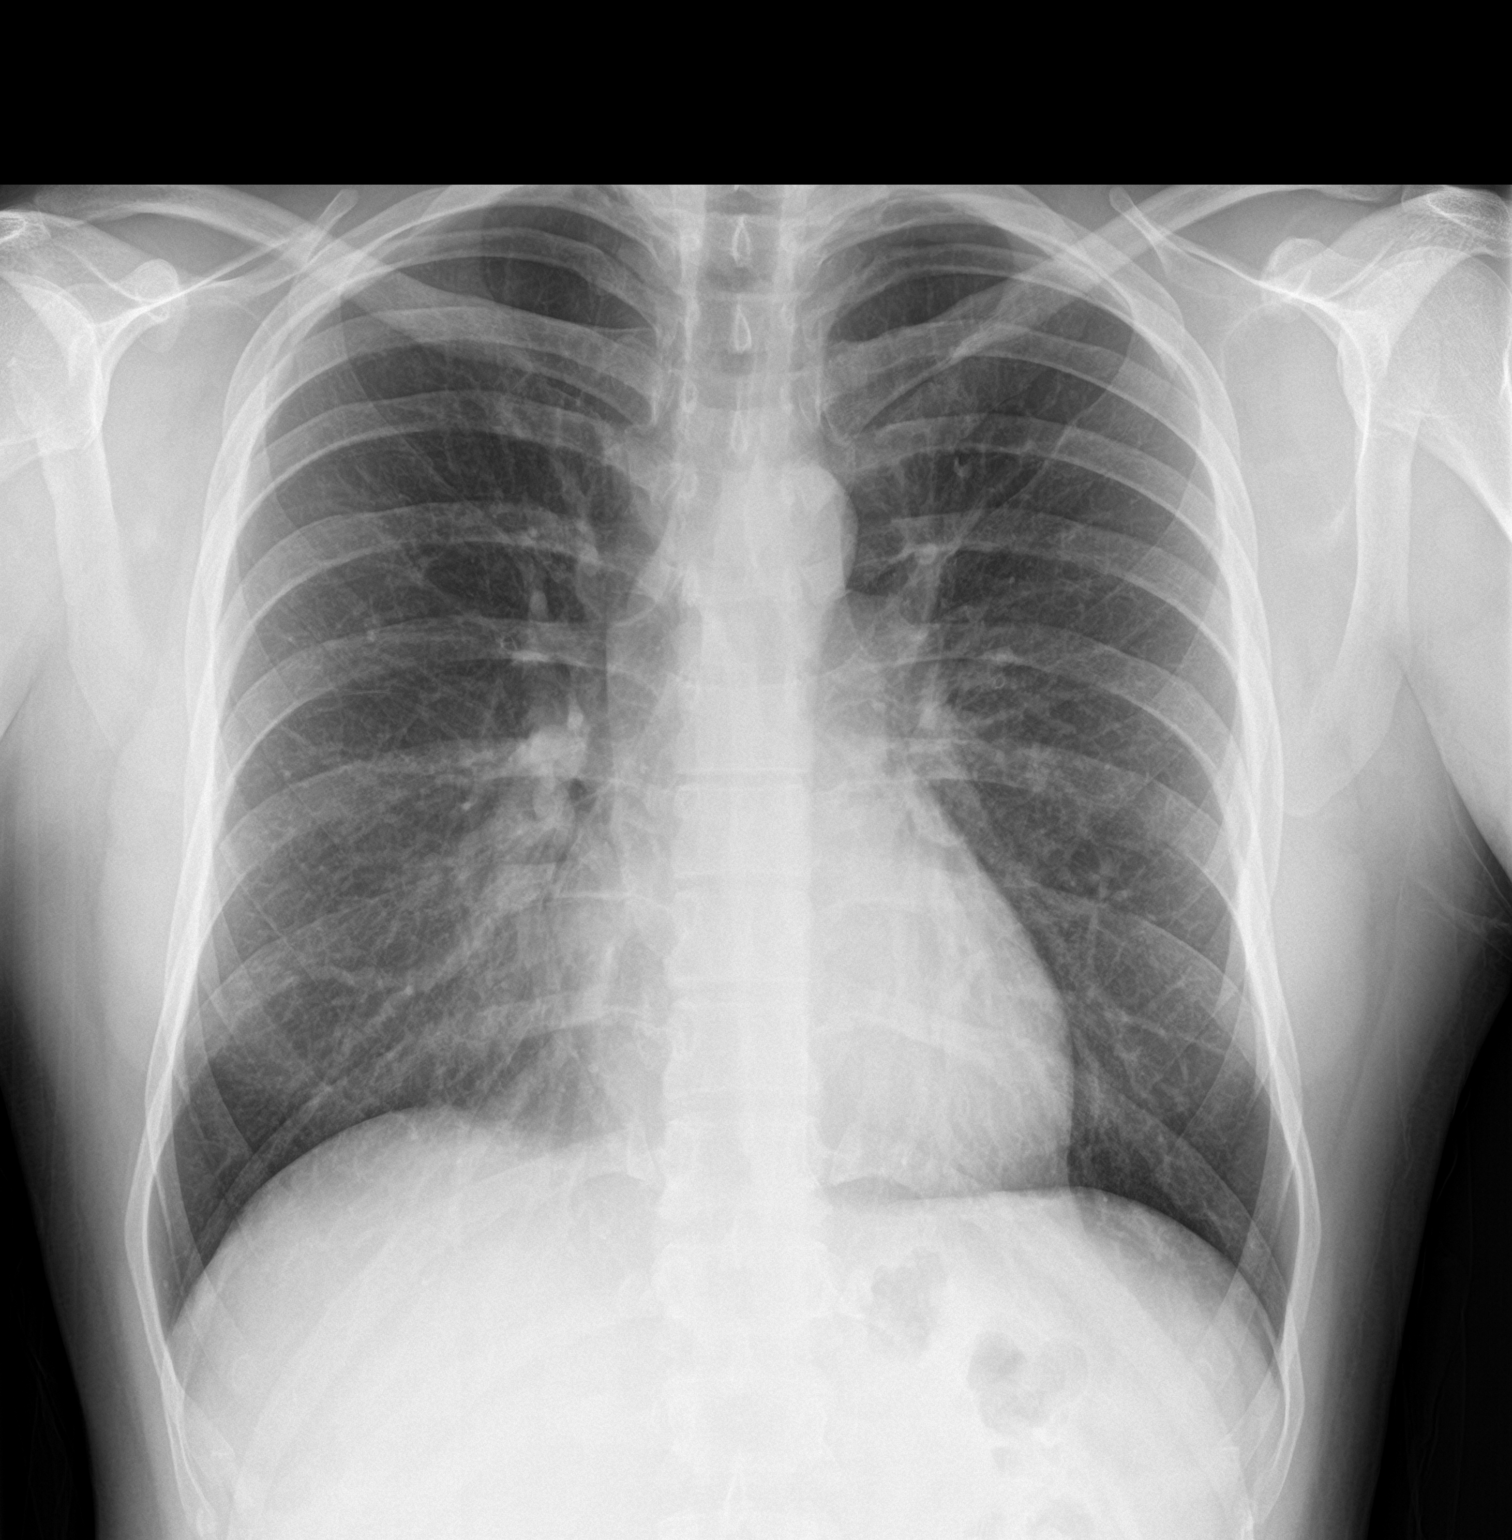
[im 2/2]
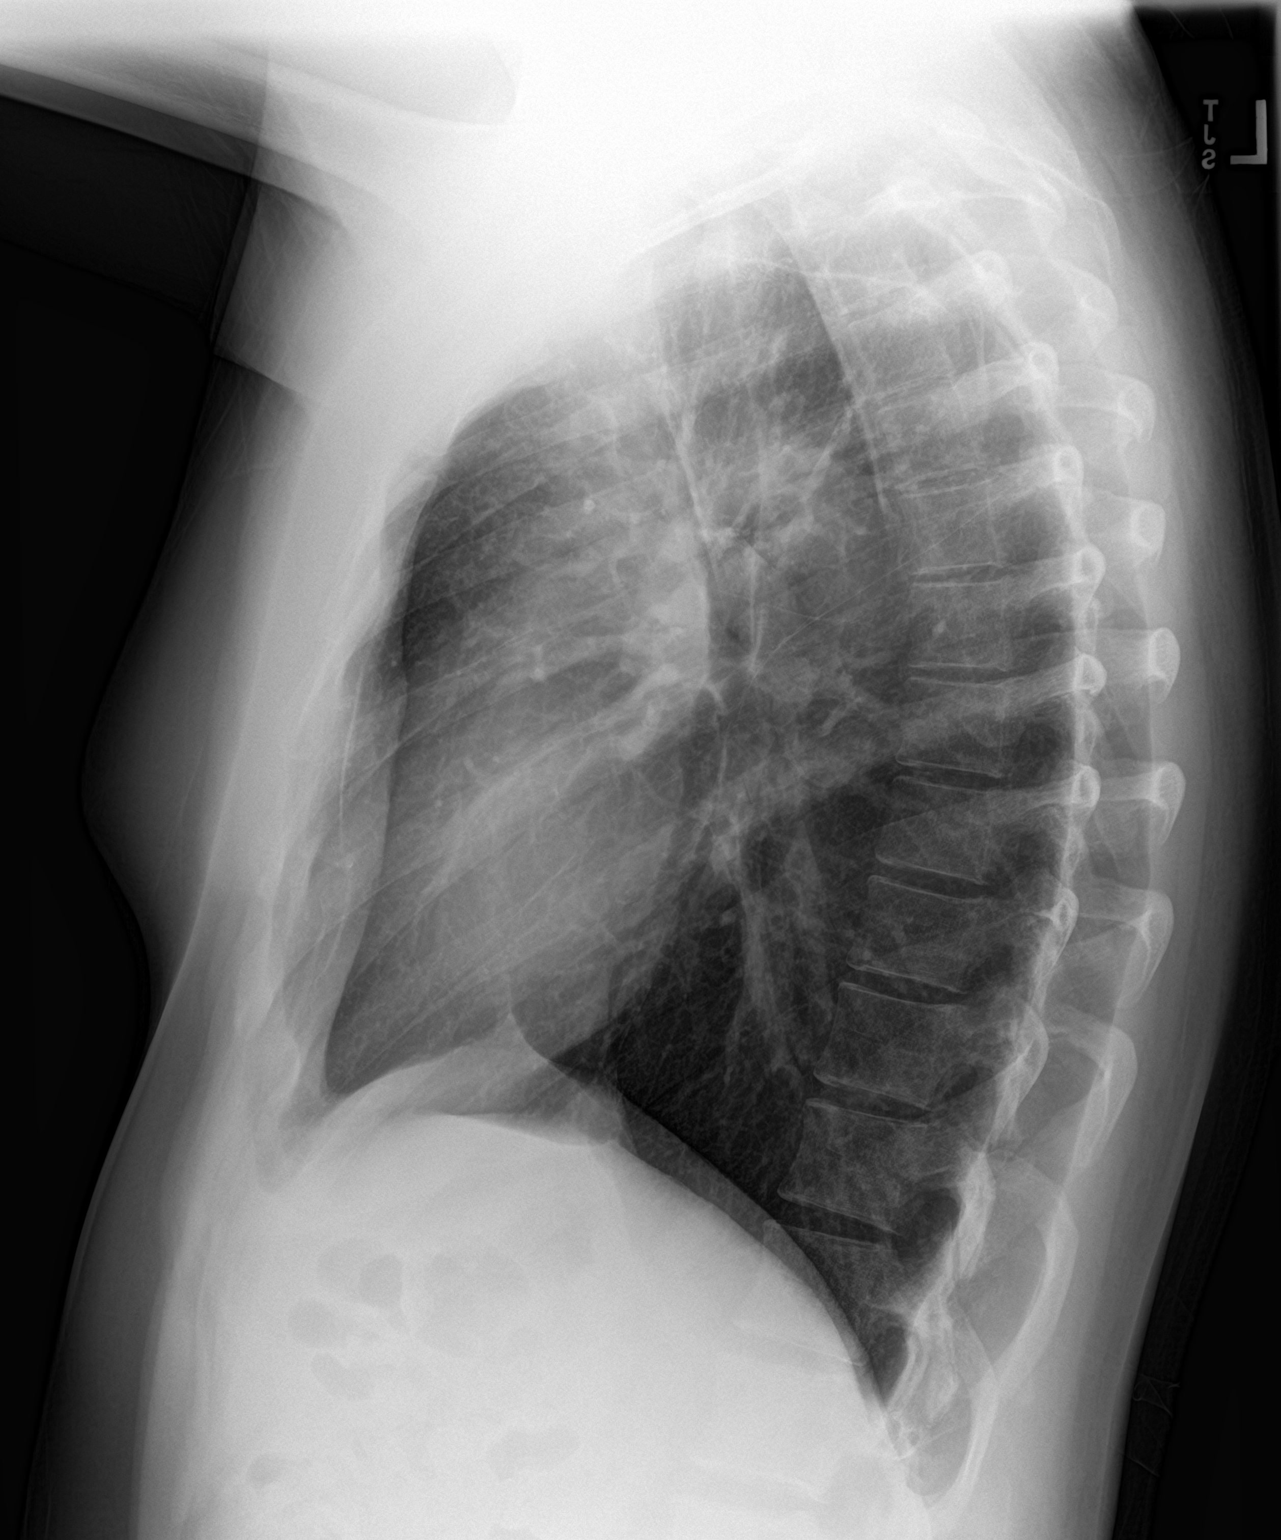

[2 of 2 positions shown; findings below may reference images not displayed]

FINDINGS: The heart size and mediastinal contours are within normal limits.
Both lungs are clear. The visualized skeletal structures are
unremarkable.
IMPRESSION: No active cardiopulmonary disease.
